# Patient Record
Sex: Male | Born: 1980 | Race: White | Hispanic: No | Marital: Single
Health system: Southern US, Community
[De-identification: ages and names within clinical notes are randomized; demographics above are authoritative.]

---

## 2021-07-20 ENCOUNTER — Emergency Department (HOSPITAL_COMMUNITY): Payer: Medicaid Other

## 2021-07-20 ENCOUNTER — Emergency Department (HOSPITAL_COMMUNITY)
Admission: EM | Admit: 2021-07-20 | Discharge: 2021-07-20 | Disposition: A | Discharge status: Home / Self Care | Payer: Medicaid Other | Attending: Emergency Medicine | Admitting: Emergency Medicine

## 2021-07-20 ENCOUNTER — Other Ambulatory Visit: Payer: Self-pay

## 2021-07-20 ENCOUNTER — Encounter (HOSPITAL_COMMUNITY): Payer: Self-pay | Admitting: Radiology

## 2021-07-20 DIAGNOSIS — S90511A Abrasion, right ankle, initial encounter: Secondary | ICD-10-CM | POA: Insufficient documentation

## 2021-07-20 DIAGNOSIS — S90512A Abrasion, left ankle, initial encounter: Secondary | ICD-10-CM | POA: Diagnosis not present

## 2021-07-20 DIAGNOSIS — S21319A Laceration without foreign body of unspecified front wall of thorax with penetration into thoracic cavity, initial encounter: Secondary | ICD-10-CM | POA: Diagnosis not present

## 2021-07-20 DIAGNOSIS — S61411A Laceration without foreign body of right hand, initial encounter: Secondary | ICD-10-CM | POA: Insufficient documentation

## 2021-07-20 DIAGNOSIS — S80212A Abrasion, left knee, initial encounter: Secondary | ICD-10-CM | POA: Diagnosis not present

## 2021-07-20 DIAGNOSIS — Z20822 Contact with and (suspected) exposure to covid-19: Secondary | ICD-10-CM | POA: Insufficient documentation

## 2021-07-20 DIAGNOSIS — T07XXXA Unspecified multiple injuries, initial encounter: Secondary | ICD-10-CM

## 2021-07-20 DIAGNOSIS — S80211A Abrasion, right knee, initial encounter: Secondary | ICD-10-CM | POA: Diagnosis not present

## 2021-07-20 DIAGNOSIS — R0789 Other chest pain: Secondary | ICD-10-CM | POA: Diagnosis not present

## 2021-07-20 DIAGNOSIS — T1490XA Injury, unspecified, initial encounter: Secondary | ICD-10-CM

## 2021-07-20 DIAGNOSIS — Z23 Encounter for immunization: Secondary | ICD-10-CM | POA: Diagnosis not present

## 2021-07-20 DIAGNOSIS — S20411A Abrasion of right back wall of thorax, initial encounter: Secondary | ICD-10-CM | POA: Diagnosis not present

## 2021-07-20 DIAGNOSIS — R109 Unspecified abdominal pain: Secondary | ICD-10-CM | POA: Diagnosis not present

## 2021-07-20 DIAGNOSIS — W1789XA Other fall from one level to another, initial encounter: Secondary | ICD-10-CM | POA: Insufficient documentation

## 2021-07-20 LAB — CBC
HCT: 36.8 % — ABNORMAL LOW (ref 39.0–52.0)
Hemoglobin: 12.4 g/dL — ABNORMAL LOW (ref 13.0–17.0)
MCH: 29.9 pg (ref 26.0–34.0)
MCHC: 33.7 g/dL (ref 30.0–36.0)
MCV: 88.7 fL (ref 80.0–100.0)
Platelets: 231 10*3/uL (ref 150–400)
RBC: 4.15 MIL/uL — ABNORMAL LOW (ref 4.22–5.81)
RDW: 12.9 % (ref 11.5–15.5)
WBC: 7.7 10*3/uL (ref 4.0–10.5)
nRBC: 0 % (ref 0.0–0.2)

## 2021-07-20 LAB — RESP PANEL BY RT-PCR (FLU A&B, COVID) ARPGX2
Influenza A by PCR: NEGATIVE
Influenza B by PCR: NEGATIVE
SARS Coronavirus 2 by RT PCR: NEGATIVE

## 2021-07-20 LAB — I-STAT CHEM 8, ED
BUN: 13 mg/dL (ref 6–20)
Calcium, Ion: 1.07 mmol/L — ABNORMAL LOW (ref 1.15–1.40)
Chloride: 110 mmol/L (ref 98–111)
Creatinine, Ser: 0.7 mg/dL (ref 0.61–1.24)
Glucose, Bld: 95 mg/dL (ref 70–99)
HCT: 36 % — ABNORMAL LOW (ref 39.0–52.0)
Hemoglobin: 12.2 g/dL — ABNORMAL LOW (ref 13.0–17.0)
Potassium: 3.3 mmol/L — ABNORMAL LOW (ref 3.5–5.1)
Sodium: 142 mmol/L (ref 135–145)
TCO2: 20 mmol/L — ABNORMAL LOW (ref 22–32)

## 2021-07-20 LAB — COMPREHENSIVE METABOLIC PANEL
ALT: 16 U/L (ref 0–44)
AST: 25 U/L (ref 15–41)
Albumin: 3.7 g/dL (ref 3.5–5.0)
Alkaline Phosphatase: 54 U/L (ref 38–126)
Anion gap: 7 (ref 5–15)
BUN: 13 mg/dL (ref 6–20)
CO2: 21 mmol/L — ABNORMAL LOW (ref 22–32)
Calcium: 8.6 mg/dL — ABNORMAL LOW (ref 8.9–10.3)
Chloride: 111 mmol/L (ref 98–111)
Creatinine, Ser: 0.81 mg/dL (ref 0.61–1.24)
GFR, Estimated: >60 mL/min (ref >60)
Glucose, Bld: 97 mg/dL (ref 70–99)
Potassium: 3.3 mmol/L — ABNORMAL LOW (ref 3.5–5.1)
Sodium: 139 mmol/L (ref 135–145)
Total Bilirubin: 1.7 mg/dL — ABNORMAL HIGH (ref 0.3–1.2)
Total Protein: 6 g/dL — ABNORMAL LOW (ref 6.5–8.1)

## 2021-07-20 LAB — PROTIME-INR
INR: 1.1 (ref 0.8–1.2)
Prothrombin Time: 14.6 seconds (ref 11.4–15.2)

## 2021-07-20 LAB — SAMPLE TO BLOOD BANK

## 2021-07-20 LAB — ETHANOL: Alcohol, Ethyl (B): <10 mg/dL (ref <10)

## 2021-07-20 LAB — LACTIC ACID, PLASMA: Lactic Acid, Venous: 1.3 mmol/L (ref 0.5–1.9)

## 2021-07-20 MED ORDER — LIDOCAINE-EPINEPHRINE (PF) 2 %-1:200000 IJ SOLN
INTRAMUSCULAR | Status: AC
  Administered 2021-07-20: 20 mL
  Filled 2021-07-20: qty 20

## 2021-07-20 MED ORDER — TETANUS-DIPHTH-ACELL PERTUSSIS 5-2.5-18.5 LF-MCG/0.5 IM SUSY
0.5000 mL | PREFILLED_SYRINGE | Freq: Once | INTRAMUSCULAR | Status: AC
  Administered 2021-07-20: 0.5 mL via INTRAMUSCULAR
  Filled 2021-07-20: qty 0.5

## 2021-07-20 MED ORDER — IOHEXOL 300 MG/ML  SOLN
100.0000 mL | Freq: Once | INTRAMUSCULAR | Status: AC | PRN
  Administered 2021-07-20: 100 mL via INTRAVENOUS

## 2021-07-20 MED ORDER — SODIUM CHLORIDE 0.9 % IV BOLUS
500.0000 mL | Freq: Once | INTRAVENOUS | Status: AC
  Administered 2021-07-20: 500 mL via INTRAVENOUS

## 2021-07-20 MED ORDER — FENTANYL CITRATE (PF) 100 MCG/2ML IJ SOLN
50.0000 ug | Freq: Once | INTRAMUSCULAR | Status: AC
Start: 2021-07-20 — End: 2021-07-20
  Administered 2021-07-20: 50 ug via INTRAVENOUS
  Filled 2021-07-20: qty 2

## 2021-07-20 MED ORDER — CEFAZOLIN SODIUM-DEXTROSE 2-4 GM/100ML-% IV SOLN
2.0000 g | Freq: Once | INTRAVENOUS | Status: AC
  Administered 2021-07-20: 2 g via INTRAVENOUS
  Filled 2021-07-20: qty 100

## 2021-07-20 NOTE — ED Notes (Signed)
Pt arrived by [facility] after his tractor lawnmower fell on top of him while attempting to load it onto trailer.  Lawnmower pinned him underneath and pt suffered a significant lac to R hand.  C/O pain to R hand, chest, upper back, bilateral knees and bilateral ankles.  VSS.  A&Ox4.

## 2021-07-20 NOTE — ED Notes (Signed)
Pt to CT

## 2021-07-20 NOTE — ED Notes (Signed)
Pt's significant other Barry Dienes [phone]

## 2021-07-20 NOTE — Progress Notes (Signed)
Orthopedic Tech Progress Note Patient Details:  Pasqualino Witherspoon [birth year] [id number]  Level 2 trauma here now room #8  Patient ID: Marlene Bast, male   DOB: 11/26/1981, 40 y.o.   MRN: [id number]  Donald Pore 07/20/2021, 4:39 PM

## 2021-07-20 NOTE — Progress Notes (Addendum)
   07/20/21 1540  Clinical Encounter Type  Visited With Patient not available  Visit Type Trauma  Referral From Nurse  Consult/Referral To Chaplain   Chaplain responded to level 2 page. The patient is being attended to by the medical team. There is no support person present. Chaplain remains available. This note was prepared by Deneen Harts, M.Div..  For questions please contact by phone [phone].

## 2021-07-20 NOTE — Discharge Instructions (Addendum)
These keep dressing in place Call Dr. Bari Edward office in the morning for appointment for recheck next week Return to the emergency department if you are having increased pain, swelling, discharge, or redness

## 2021-07-20 NOTE — Progress Notes (Signed)
Orthopedic Tech Progress Note Patient Details:  Thadd Apuzzo 10-Sep-1981 [id number]  Ortho Devices Type of Ortho Device: Thumb velcro splint Ortho Device/Splint Location: RUE Ortho Device/Splint Interventions: Ordered, Application, Adjustment   Post Interventions Patient Tolerated: Fair Instructions Provided: Adjustment of device, Care of device, Poper ambulation with device  Kelvin Sennett 07/20/2021, 8:57 PM

## 2021-07-20 NOTE — ED Triage Notes (Signed)
Pt BIB [facility], lawnmower fell onto pt's Right hand while he was trying to load it on a trailer. Significant lac noted, bleeding controlled on arrival

## 2021-07-20 NOTE — ED Provider Notes (Signed)
[facility] EMERGENCY DEPARTMENT Provider Note   CSN: [id number] Arrival date & time: 07/20/21  1640     History Chief Complaint  Patient presents with   Laceration    Manuel Mccall is a 40 y.o. male.  HPI  40 year old male presents today after having a fall onto him.  He was trying to loaded onto something and fell and knocked him to the ground.  He has a laceration to his right hand and thenar eminence.  He has multiple other abrasions.  He was pinned under it but was able to get out and called ambulance.  He presented as a level 2 trauma.  He denies any head injury, loss of conscious, neck pain, numbness, or tingling or back pain.    History reviewed. No pertinent past medical history.  There are no problems to display for this patient.    No family history on file.     Home Medications Prior to Admission medications   Not on File    Allergies    Patient has no known allergies.  Review of Systems   Review of Systems  All other systems reviewed and are negative.  Physical Exam Updated Vital Signs BP 132/80   Pulse 65   Temp (!) 97.5 F (36.4 C) (Oral)   Resp 20   Ht 1.905 m (6\' 3" )   Wt 86.2 kg   SpO2 99%   BMI 23.75 kg/m   Physical Exam Vitals and nursing note reviewed.  Constitutional:      General: He is in acute distress.     Appearance: Normal appearance. He is not ill-appearing.  HENT:     Head: Normocephalic and atraumatic.     Right Ear: External ear normal.     Left Ear: External ear normal.     Nose: Nose normal.     Mouth/Throat:     Pharynx: Oropharynx is clear.  Eyes:     Extraocular Movements: Extraocular movements intact.     Pupils: Pupils are equal, round, and reactive to light.  Cardiovascular:     Rate and Rhythm: Normal rate and regular rhythm.  Pulmonary:     Effort: Pulmonary effort is normal.     Breath sounds: Normal breath sounds.     Comments: Mild tenderness palpation with abrasion right upper  back Abdominal:     General: Abdomen is flat.     Palpations: Abdomen is soft.     Comments: No external signs of trauma on abdomen  Musculoskeletal:     Cervical back: Normal range of motion.     Comments: 4 cm laceration thenar eminence right hand He has had some decreased movement throughout all of his fingers that appears to be due to pain He has some decreased sharp sensation to all fingers He is able to flex and extend at the wrist He is able to move his fingertips although not through full range of motion pulses and ulnar pulses intact Abrasions bilateral ankles and knees with mild tenderness Full active range of motion bilateral upper extremities and lower extremities Pelvis is stable with no tenderness  Skin:    General: Skin is warm and dry.     Capillary Refill: Capillary refill takes less than 2 seconds.  Neurological:     General: No focal deficit present.     Mental Status: He is alert.     Cranial Nerves: No cranial nerve deficit.     Motor: No weakness.  Psychiatric:  Mood and Affect: Mood normal.    ED Results / Procedures / Treatments   Labs (all labs ordered are listed, but only abnormal results are displayed) Labs Reviewed  COMPREHENSIVE METABOLIC PANEL - Abnormal; Notable for the following components:      Result Value   Potassium 3.3 (*)    CO2 21 (*)    Calcium 8.6 (*)    Total Protein 6.0 (*)    Total Bilirubin 1.7 (*)    All other components within normal limits  CBC - Abnormal; Notable for the following components:   RBC 4.15 (*)    Hemoglobin 12.4 (*)    HCT 36.8 (*)    All other components within normal limits  I-STAT CHEM 8, ED - Abnormal; Notable for the following components:   Potassium 3.3 (*)    Calcium, Ion 1.07 (*)    TCO2 20 (*)    Hemoglobin 12.2 (*)    HCT 36.0 (*)    All other components within normal limits  RESP PANEL BY RT-PCR (FLU A&B, COVID) ARPGX2  ETHANOL  LACTIC ACID, PLASMA  PROTIME-INR  URINALYSIS, ROUTINE W  REFLEX MICROSCOPIC  URINALYSIS, ROUTINE W REFLEX MICROSCOPIC  I-STAT CHEM 8, ED  SAMPLE TO BLOOD BANK    EKG None  Radiology DG Knee 1-2 Views Left  Result Date: 07/20/2021 CLINICAL DATA:  Trauma EXAM: LEFT KNEE - 1-2 VIEW COMPARISON:  None. FINDINGS: There is no evidence of acute fracture. There is mild medial and lateral joint space narrowing. There is no significant joint effusion. There is prepatellar soft tissue swelling. IMPRESSION: Prepatellar soft tissue swelling. No acute osseous abnormality or significant joint effusion. Electronically Signed   By: Caprice Renshaw   On: 07/20/2021 18:25   DG Knee 1-2 Views Right  Result Date: 07/20/2021 CLINICAL DATA:  Trauma EXAM: RIGHT KNEE - 1-2 VIEW COMPARISON:  None. FINDINGS: There is no evidence of acute fracture. There is mild medial and lateral joint space narrowing. There is no significant joint effusion. There is mild prepatellar soft tissue swelling. IMPRESSION: No evidence of acute fracture or significant joint effusion. Mild prepatellar soft tissue swelling. Electronically Signed   By: Caprice Renshaw   On: 07/20/2021 18:26   DG Ankle 2 Views Left  Result Date: 07/20/2021 CLINICAL DATA:  Trauma EXAM: LEFT ANKLE - 2 VIEW COMPARISON:  None. FINDINGS: There is a tibiotalar joint effusion and swelling posteriorly. No visible fracture. No dislocation. Plantar calcaneal spurring. IMPRESSION: Tibiotalar effusion with soft tissue swelling posteriorly. No visible fracture. Electronically Signed   By: Caprice Renshaw   On: 07/20/2021 18:27   DG Ankle 2 Views Right  Result Date: 07/20/2021 CLINICAL DATA:  Trauma EXAM: RIGHT ANKLE - 2 VIEW COMPARISON:  None. FINDINGS: There is no acute fracture or dislocation. No significant degenerative change. IMPRESSION: Negative right ankle radiographs. Electronically Signed   By: Caprice Renshaw   On: 07/20/2021 18:28   DG Pelvis Portable  Result Date: 07/20/2021 CLINICAL DATA:  Level 2 trauma EXAM: PORTABLE PELVIS 1-2  VIEWS COMPARISON:  None. FINDINGS: There is no evidence of pelvic fracture or diastasis. No pelvic bone lesions are seen. IMPRESSION: Negative. Electronically Signed   By: Malachy Moan M.D.   On: 07/20/2021 17:07   CT CHEST ABDOMEN PELVIS W CONTRAST  Result Date: 07/20/2021 CLINICAL DATA:  40 year old male with trauma. EXAM: CT CHEST, ABDOMEN, AND PELVIS WITH CONTRAST TECHNIQUE: Multidetector CT imaging of the chest, abdomen and pelvis was performed following the standard protocol during  bolus administration of intravenous contrast. CONTRAST:  OMNIPAQUE IOHEXOL 300 MG/ML  SOLN COMPARISON:  Chest radiograph dated 07/20/2021. FINDINGS: Evaluation of this exam is limited due to respiratory motion artifact. CT CHEST FINDINGS Cardiovascular: There is no cardiomegaly or pericardial effusion. The thoracic aorta and central pulmonary arteries appear unremarkable. The origins of the great vessels of the aortic arch appear patent as visualized. Mediastinum/Nodes: No hilar or mediastinal adenopathy. The esophagus is grossly unremarkable. No mediastinal fluid collection. Lungs/Pleura: The lungs are clear. There is no pleural effusion pneumothorax. The central airways are patent. Musculoskeletal: No obvious acute osseous pathology. CT ABDOMEN PELVIS FINDINGS No intra-abdominal free air or free fluid. Hepatobiliary: No focal liver abnormality is seen. No gallstones, gallbladder wall thickening, or biliary dilatation. Pancreas: Unremarkable. No pancreatic ductal dilatation or surrounding inflammatory changes. Spleen: Normal in size without focal abnormality. Adrenals/Urinary Tract: Adrenal glands are unremarkable. Kidneys are normal, without renal calculi, focal lesion, or hydronephrosis. Bladder is unremarkable. Stomach/Bowel: There is no bowel obstruction or active inflammation. The appendix is not visualized with certainty. No inflammatory changes identified in the right lower quadrant. Vascular/Lymphatic: The  abdominal aorta and IVC are unremarkable. No portal venous gas. There is no adenopathy. Reproductive: The prostate and seminal vesicles are grossly unremarkable. No pelvic mass. Other: None Musculoskeletal: No acute or significant osseous findings. IMPRESSION: No acute/traumatic intrathoracic, abdominal, or pelvic pathology. Electronically Signed   By: Elgie Collard M.D.   On: 07/20/2021 17:56   DG Chest Port 1 View  Result Date: 07/20/2021 CLINICAL DATA:  Level 2 trauma, lacerations along the chest. EXAM: PORTABLE CHEST 1 VIEW COMPARISON:  Chest radiograph 03/19/2014 FINDINGS: The heart size and mediastinal contours are within normal limits. Both lungs are clear. The visualized skeletal structures are unremarkable. IMPRESSION: No active disease. Electronically Signed   By: Gaylyn Rong M.D.   On: 07/20/2021 17:14   DG Hand Complete Right  Result Date: 07/20/2021 CLINICAL DATA:  Level 2 trauma EXAM: RIGHT HAND - COMPLETE 3+ VIEW COMPARISON:  None. FINDINGS: There is no evidence of fracture or dislocation. There is no evidence of arthropathy or other focal bone abnormality. Soft tissues irregularity along the thenar eminence consistent with laceration. IMPRESSION: No acute fracture or retained radiopaque foreign body. Electronically Signed   By: Malachy Moan M.D.   On: 07/20/2021 17:09    Procedures .Marland KitchenLaceration Repair  Date/Time: 07/20/2021 7:39 PM Performed by: Margarita Grizzle, MD Authorized by: Margarita Grizzle, MD   Consent:    Consent obtained:  Verbal   Consent given by:  Patient   Risks, benefits, and alternatives were discussed: yes     Risks discussed:  Infection, pain, retained foreign body, need for additional repair and poor cosmetic result   Alternatives discussed:  No treatment Universal protocol:    Procedure explained and questions answered to patient or proxy's satisfaction: yes     Relevant documents present and verified: yes     Patient identity confirmed:   Verbally with patient Anesthesia:    Anesthesia method:  Nerve block   Block needle gauge:  24 G   Block anesthetic:  Lidocaine 1% WITH epi   Block technique:  [place] radial nerve block   Block outcome:  Anesthesia achieved Laceration details:    Location:  Hand   Hand location:  R palm   Length (cm):  5 Pre-procedure details:    Preparation:  Patient was prepped and draped in usual sterile fashion and imaging obtained to evaluate for foreign bodies Exploration:  Hemostasis achieved with:  Direct pressure   Imaging outcome: foreign body not noted     Wound exploration: wound explored through full range of motion     Contaminated: yes   Treatment:    Area cleansed with:  Saline   Amount of cleaning:  Extensive   Irrigation solution:  Sterile saline   Irrigation method:  Syringe   Scar revision: no     Layers/structures repaired:  Muscle belly Skin repair:    Repair method:  Sutures   Suture size:  3-0   Suture material:  Nylon   Suture technique:  Simple interrupted   Number of sutures:  6 Approximation:    Approximation:  Loose Repair type:    Repair type:  Simple Post-procedure details:    Dressing:  Bulky dressing   Procedure completion:  Tolerated   Medications Ordered in ED Medications  ceFAZolin (ANCEF) IVPB 2g/100 mL premix (0 g Intravenous Stopped 07/20/21 1925)  Tdap (BOOSTRIX) injection 0.5 mL (0.5 mLs Intramuscular Given 07/20/21 1845)  sodium chloride 0.9 % bolus 500 mL (0 mLs Intravenous Stopped 07/20/21 1926)  fentaNYL (SUBLIMAZE) injection 50 mcg (50 mcg Intravenous Given 07/20/21 1841)  lidocaine-EPINEPHrine (XYLOCAINE W/EPI) 2 %-1:200000 (PF) injection (20 mLs  Given 07/20/21 1852)  iohexol (OMNIPAQUE) 300 MG/ML solution 100 mL (100 mLs Intravenous Contrast Given 07/20/21 1738)    ED Course  I have reviewed the triage vital signs and the nursing notes.  Pertinent labs & imaging results that were available during my care of the patient were reviewed by me  and considered in my medical decision making (see chart for details).    MDM Rules/Calculators/A&P                           40 yo male with lawnmower falling on him.  He has a laceration to the right thenar emesis.  Was repaired in the ER here in the ED.  A dressing and splint are placed.  Plan referral to hand surgeon for recheck and follow-up. Final Clinical Impression(s) / ED Diagnoses Final diagnoses:  Trauma  Multiple abrasions  Laceration of right hand, foreign body presence unspecified, initial encounter    Rx / DC Orders ED Discharge Orders     None        Margarita Grizzleay, Asja Frommer, MD 07/20/21 1947

## 2021-07-21 NOTE — ED Notes (Signed)
50 mcg fentanyl wasted with Vicente Masson. Forgot to waste in pyxis.

## 2021-07-21 NOTE — ED Notes (Signed)
I witnessed Manuel Mccall waste of Fentanyl

## 2022-03-03 IMAGING — DX DG HAND COMPLETE 3+V*R*
1 series · 3 of 3 positions shown · non-contrast
Comparison: None.

CLINICAL DATA: Level 2 trauma

EXAM:
RIGHT HAND - COMPLETE 3+ VIEW

[Series 1: view not recorded · 0.14mm/px · 3 of 3 slices shown]
[im 1/3]
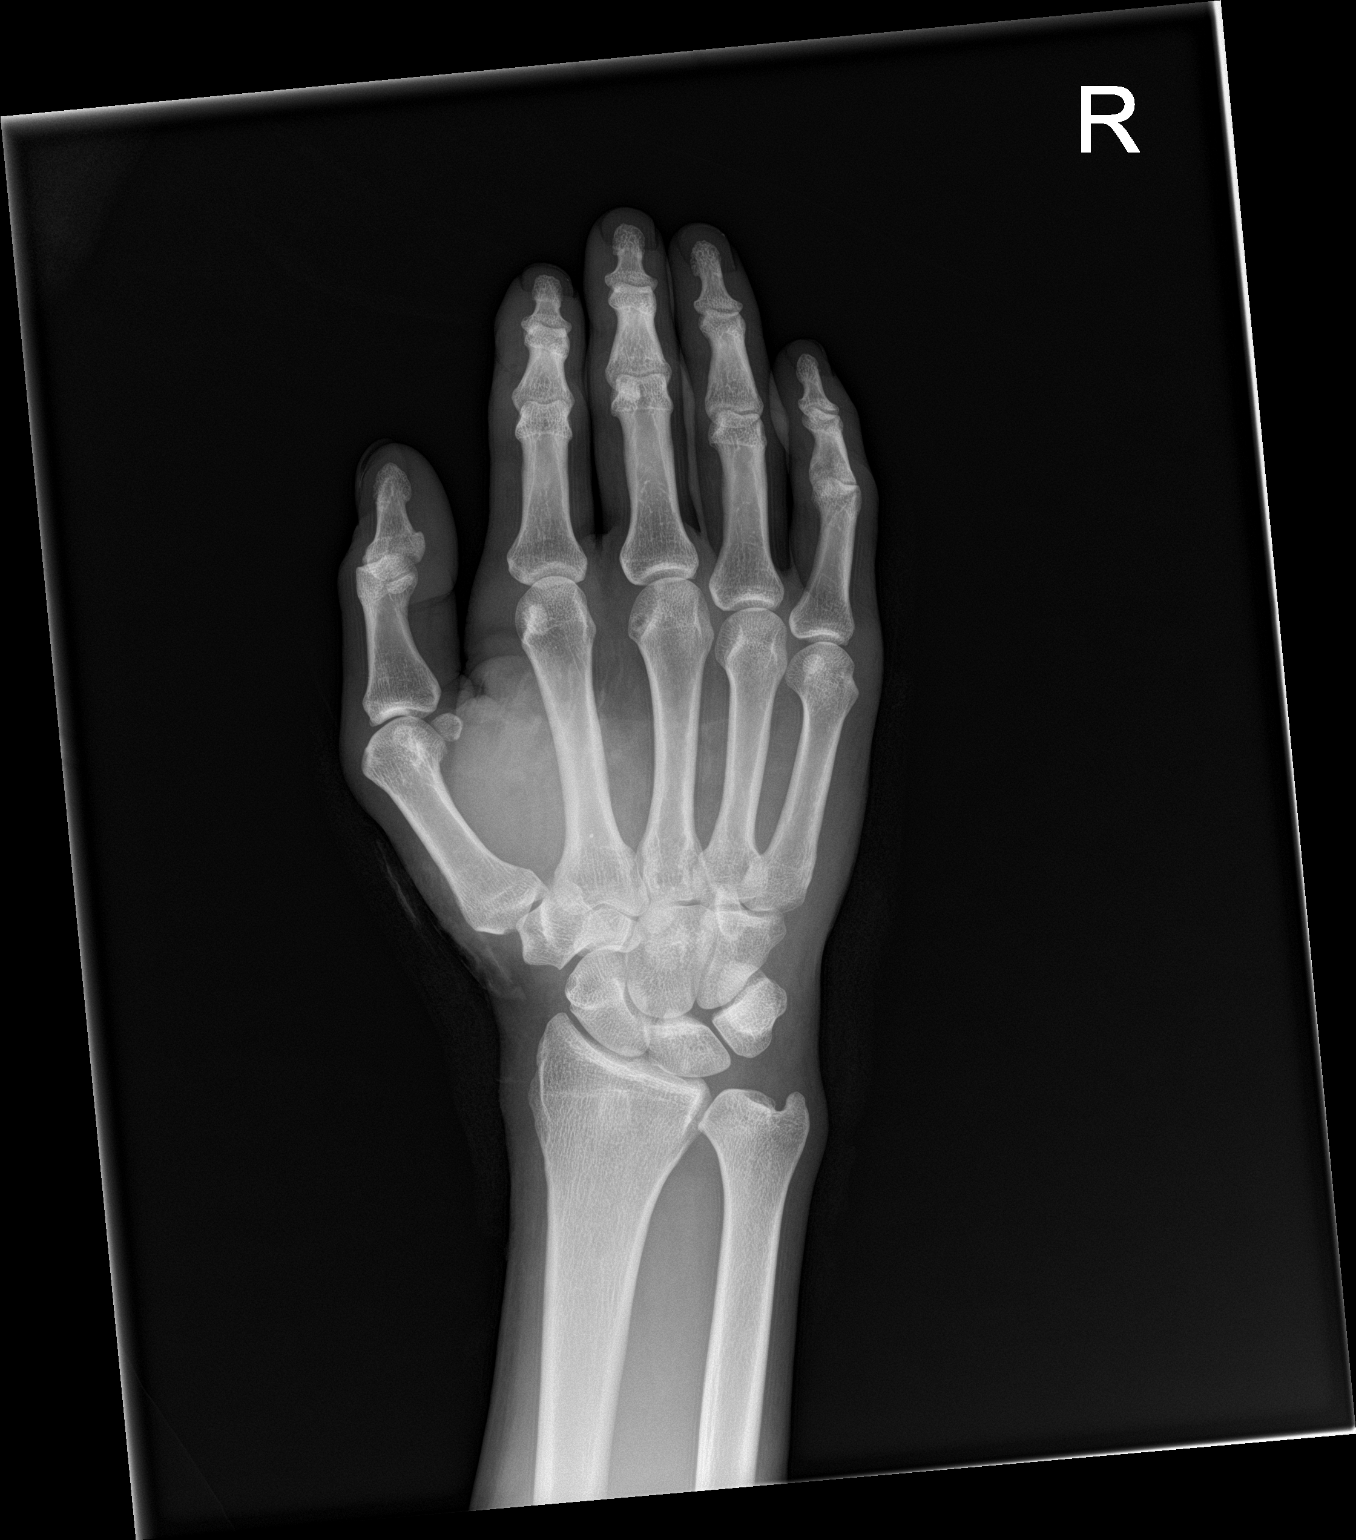
[im 2/3]
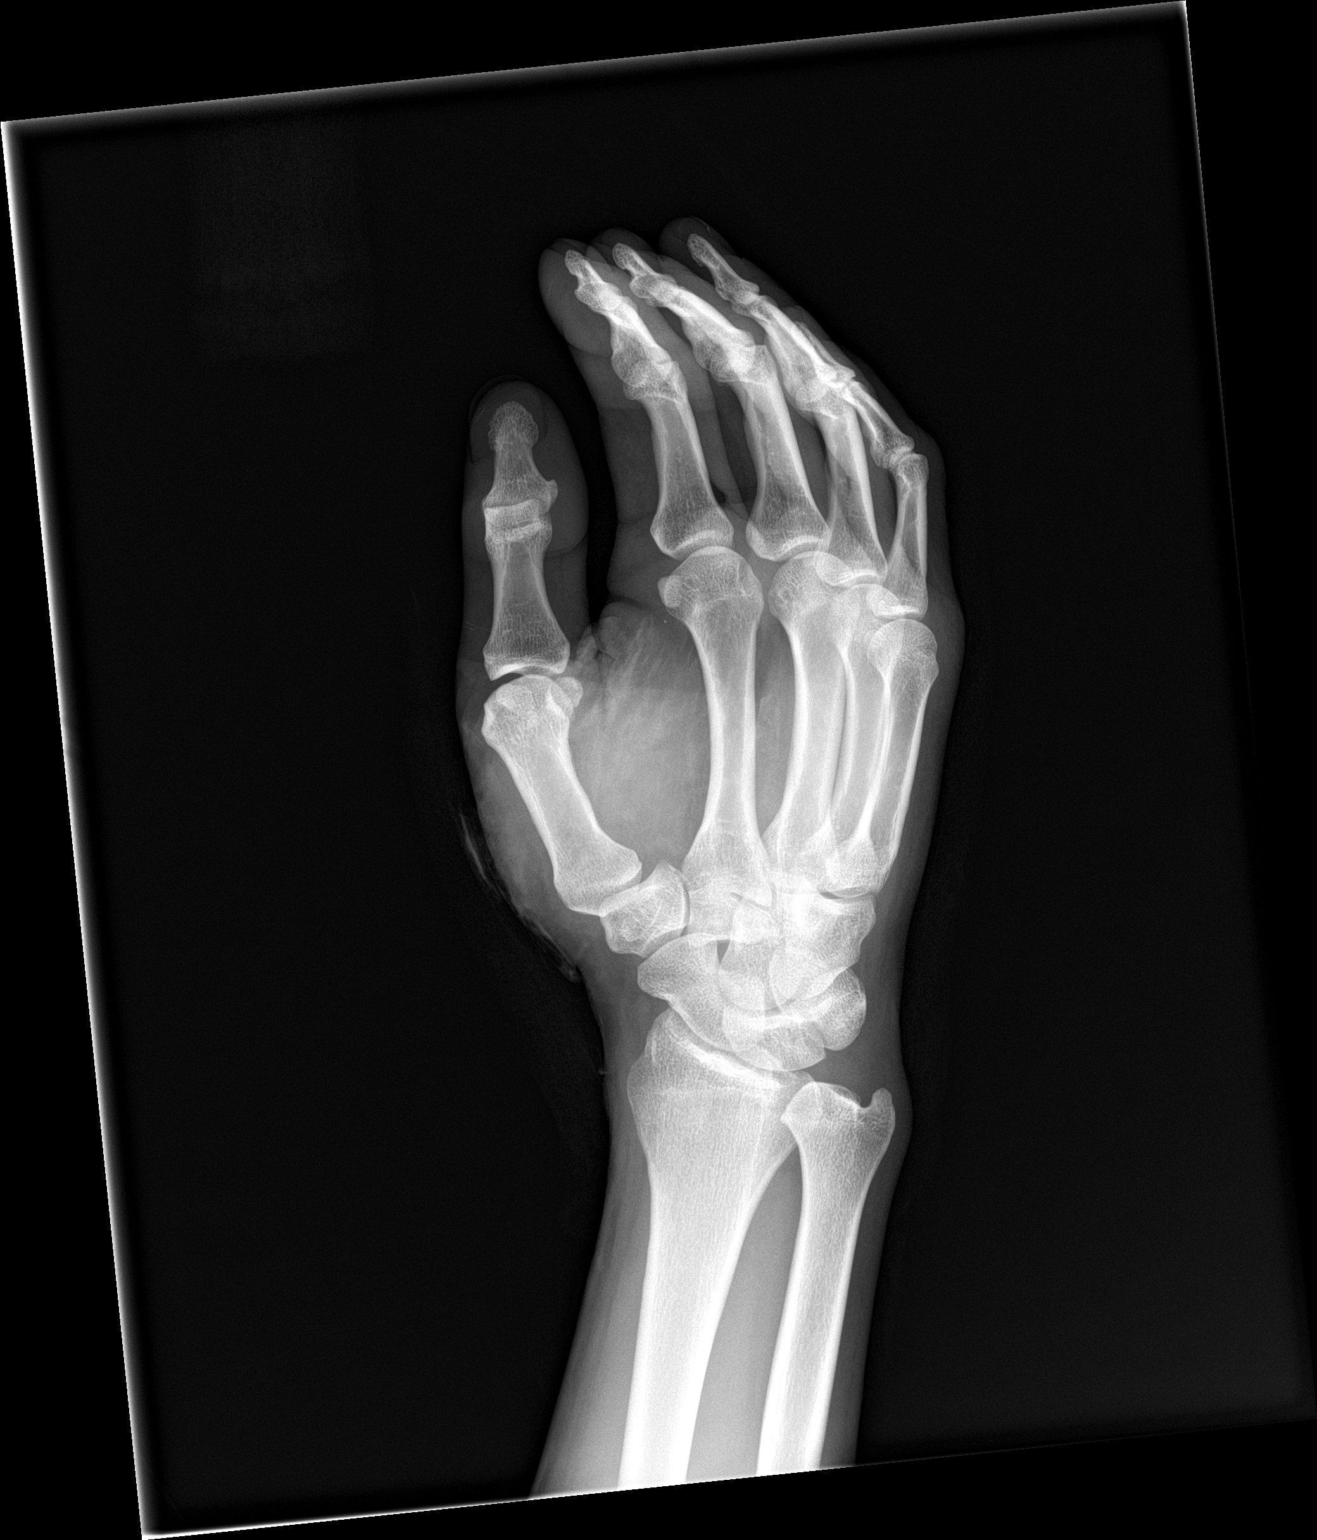
[im 3/3]
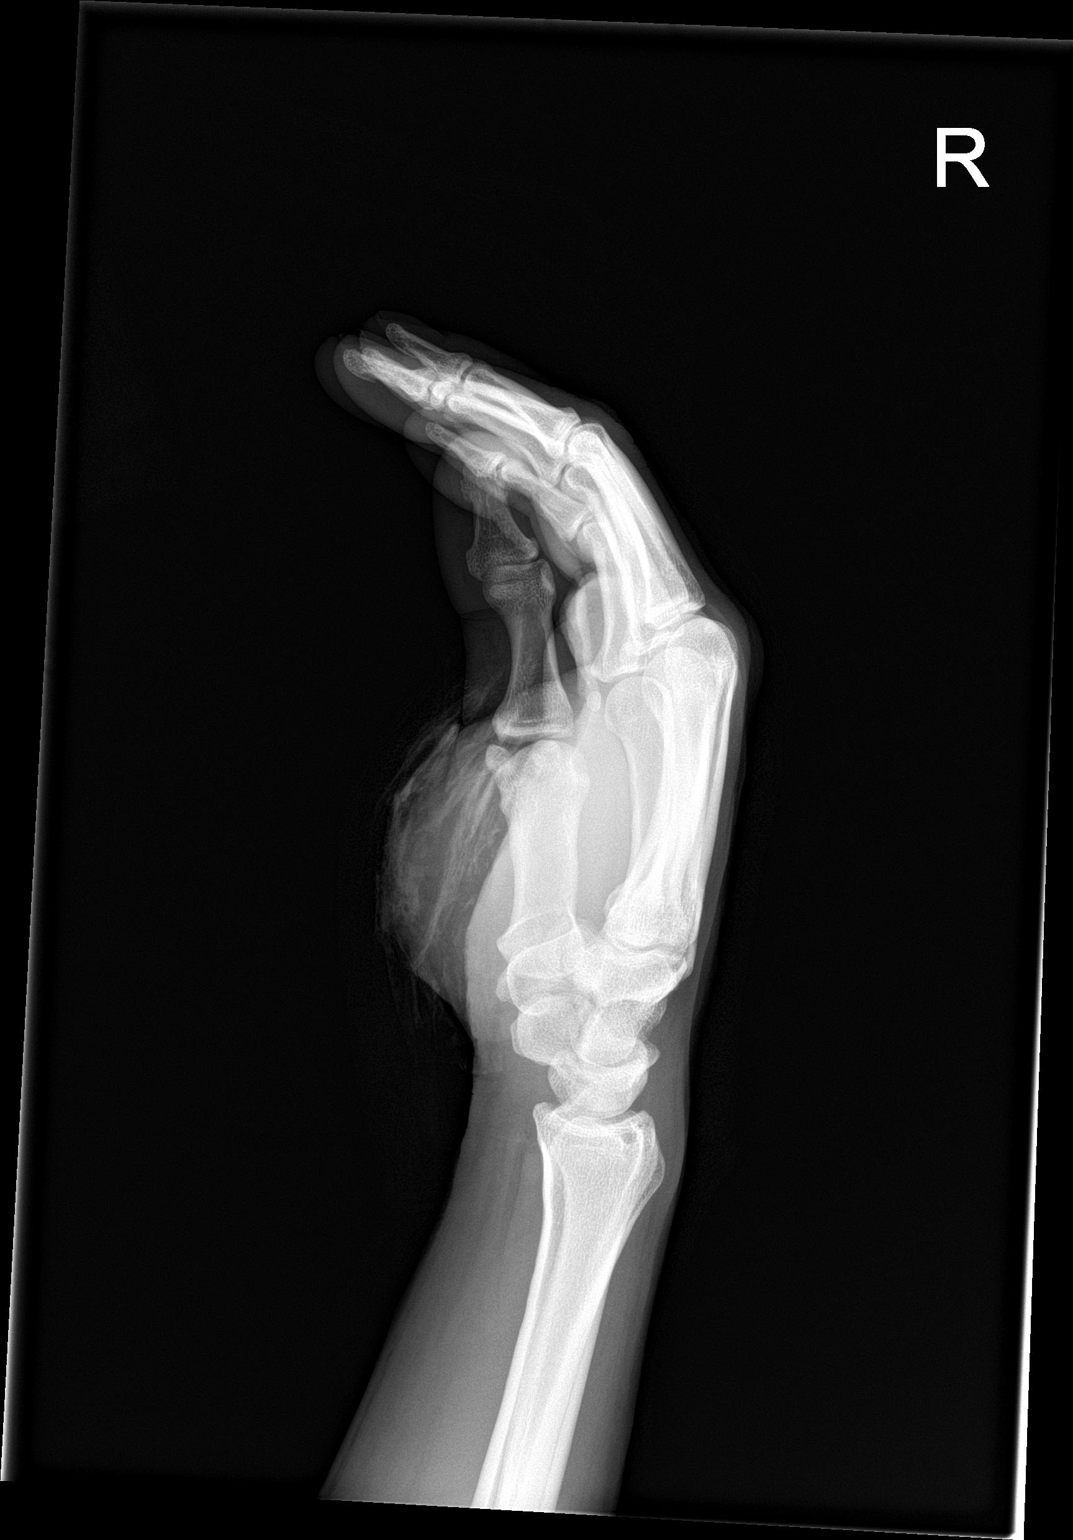

[3 of 3 positions shown; findings below may reference images not displayed]

FINDINGS: There is no evidence of fracture or dislocation. There is no
evidence of arthropathy or other focal bone abnormality. Soft
tissues irregularity along the thenar eminence consistent with
laceration.
IMPRESSION: No acute fracture or retained radiopaque foreign body.

## 2022-03-03 IMAGING — DX DG PORTABLE PELVIS
1 series · 1 of 1 positions shown · non-contrast
Comparison: None.

CLINICAL DATA: Level 2 trauma

EXAM:
PORTABLE PELVIS 1-2 VIEWS

[view not recorded]
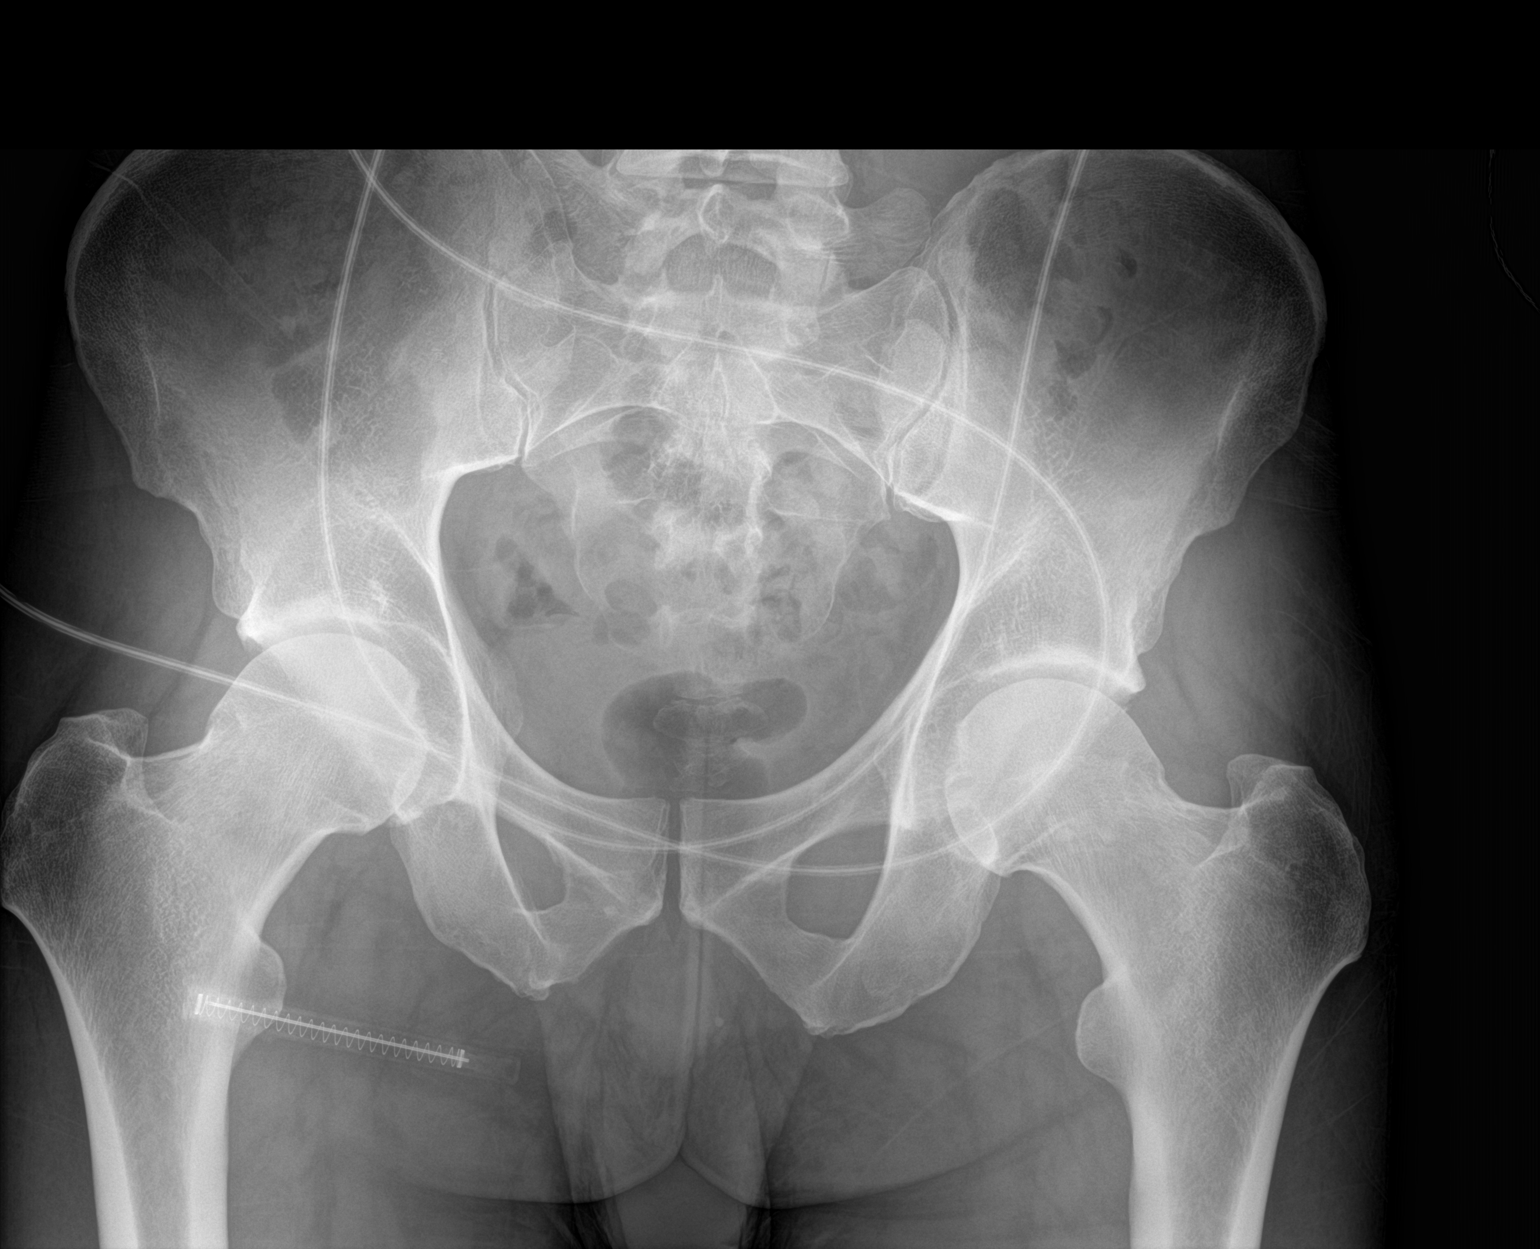

[1 of 1 positions shown; findings below may reference images not displayed]

FINDINGS: There is no evidence of pelvic fracture or diastasis. No pelvic bone
lesions are seen.
IMPRESSION: Negative.
